# Patient Record
Sex: Female | Born: 1990 | Hispanic: No | Marital: Married | Smoking: Never smoker
Health system: Southern US, Community
[De-identification: ages and names within clinical notes are randomized; demographics above are authoritative.]

## PROBLEM LIST (undated history)

## (undated) ENCOUNTER — Inpatient Hospital Stay (HOSPITAL_COMMUNITY): Payer: Self-pay

## (undated) DIAGNOSIS — D649 Anemia, unspecified: Secondary | ICD-10-CM

## (undated) HISTORY — DX: Anemia, unspecified: D64.9

---

## 2012-08-01 LAB — OB RESULTS CONSOLE ANTIBODY SCREEN: Antibody Screen: NEGATIVE

## 2012-08-01 LAB — OB RESULTS CONSOLE RPR: RPR: NONREACTIVE

## 2012-08-01 LAB — OB RESULTS CONSOLE ABO/RH: RH Type: POSITIVE

## 2012-08-01 LAB — OB RESULTS CONSOLE VARICELLA ZOSTER ANTIBODY, IGG: Varicella: IMMUNE

## 2012-08-01 LAB — OB RESULTS CONSOLE PLATELET COUNT: Platelets: 228 10*3/uL

## 2012-08-01 LAB — OB RESULTS CONSOLE HGB/HCT, BLOOD: Hemoglobin: 1.5 g/dL

## 2012-12-12 ENCOUNTER — Encounter: Payer: Self-pay | Admitting: Advanced Practice Midwife

## 2012-12-12 ENCOUNTER — Ambulatory Visit (INDEPENDENT_AMBULATORY_CARE_PROVIDER_SITE_OTHER): Payer: BC Managed Care – PPO | Admitting: Advanced Practice Midwife

## 2012-12-12 VITALS — BP 105/69 | Ht 61.0 in | Wt 199.0 lb

## 2012-12-12 DIAGNOSIS — Z3400 Encounter for supervision of normal first pregnancy, unspecified trimester: Secondary | ICD-10-CM | POA: Insufficient documentation

## 2012-12-12 DIAGNOSIS — Z3403 Encounter for supervision of normal first pregnancy, third trimester: Secondary | ICD-10-CM

## 2012-12-12 NOTE — Progress Notes (Signed)
New OB transfer from [place]. Husband is still there. Here to be near family. See other note   Subjective:    Robin Figueroa is a G1P0 [redacted]w[redacted]d being seen today for her first obstetrical visit.  Her obstetrical history is significant for obesity and nervousness about birth process. Patient does intend to breast feed. Pregnancy history fully reviewed.  Patient reports no bleeding, no contractions, no leaking and nervousness about labor.  Filed Vitals:   12/12/12 1447 12/12/12 1524  BP: 105/69   Height:  5\' 1"  (1.549 m)  Weight: 199 lb (90.266 kg)     HISTORY: OB History  Gravida Para Term Preterm AB SAB TAB Ectopic Multiple Living  1             # Outcome Date GA Lbr Len/2nd Weight Sex Delivery Anes PTL Lv  1 CUR              Past Medical History  Diagnosis Date  . Anemia    History reviewed. No pertinent past surgical history. Family History  Problem Relation Age of Onset  . Diabetes Father      Exam    Uterus:  Fundal Height: 31 cm  Pelvic Exam:    Perineum: Exam deferred   Vulva: Deferred   Vagina:  deferred   pH:    Cervix: deferred   Adnexa: not evaluated   Bony Pelvis: n/a, unproven  System: Breast:  normal appearance, no masses or tenderness   Skin: normal coloration and turgor, no rashes    Neurologic: oriented, grossly non-focal   Extremities: normal strength, tone, and muscle mass   HEENT neck supple with midline trachea   Mouth/Teeth mucous membranes moist, pharynx normal without lesions   Neck supple   Cardiovascular: regular rate and rhythm   Respiratory:  appears well, vitals normal, no respiratory distress, acyanotic, normal RR, ear and throat exam is normal   Abdomen: soft, non-tender; bowel sounds normal; no masses,  no organomegaly   Urinary: urethral meatus normal      Assessment:    Pregnancy: G1P0 Patient Active Problem List   Diagnosis Date Noted  . Supervision of normal intrauterine pregnancy in primigravida 12/12/2012         Plan:     Initial labs drawn. Prenatal vitamins. Problem list reviewed and updated. Genetic Screening discussed Quad Screen: could not find results from records, [place] normal  Ultrasound discussed; fetal survey: results reviewed.  Follow up in 2 weeks. 50% of 30 min visit spent on counseling and coordination of care.   Routines reviewed.  Reassurance that we will guide her along   [facility] 12/12/2012

## 2012-12-12 NOTE — Progress Notes (Signed)
p-108  Transfer from [place].

## 2012-12-12 NOTE — Patient Instructions (Signed)
Third Trimester of Pregnancy  The third trimester is from week 29 through week 42, months 7 through 9. The third trimester is a time when the fetus is growing rapidly. At the end of the ninth month, the fetus is about 20 inches in length and weighs 6 10 pounds.   BODY CHANGES  Your body goes through many changes during pregnancy. The changes vary from woman to woman.    Your weight will continue to increase. You can expect to gain 25 35 pounds (11 16 kg) by the end of the pregnancy.   You may begin to get stretch marks on your hips, abdomen, and breasts.   You may urinate more often because the fetus is moving lower into your pelvis and pressing on your bladder.   You may develop or continue to have heartburn as a result of your pregnancy.   You may develop constipation because certain hormones are causing the muscles that push waste through your intestines to slow down.   You may develop hemorrhoids or swollen, bulging veins (varicose veins).   You may have pelvic pain because of the weight gain and pregnancy hormones relaxing your joints between the bones in your pelvis. Back aches may result from over exertion of the muscles supporting your posture.   Your breasts will continue to grow and be tender. A yellow discharge may leak from your breasts called colostrum.   Your belly button may stick out.   You may feel short of breath because of your expanding uterus.   You may notice the fetus "dropping," or moving lower in your abdomen.   You may have a bloody mucus discharge. This usually occurs a few days to a week before labor begins.   Your cervix becomes thin and soft (effaced) near your due date.  WHAT TO EXPECT AT YOUR PRENATAL EXAMS   You will have prenatal exams every 2 weeks until week 36. Then, you will have weekly prenatal exams. During a routine prenatal visit:   You will be weighed to make sure you and the fetus are growing normally.   Your blood pressure is taken.   Your abdomen will be  measured to track your baby's growth.   The fetal heartbeat will be listened to.   Any test results from the previous visit will be discussed.   You may have a cervical check near your due date to see if you have effaced.  At around 36 weeks, your caregiver will check your cervix. At the same time, your caregiver will also perform a test on the secretions of the vaginal tissue. This test is to determine if a type of bacteria, Group B streptococcus, is present. Your caregiver will explain this further.  Your caregiver may ask you:   What your birth plan is.   How you are feeling.   If you are feeling the baby move.   If you have had any abnormal symptoms, such as leaking fluid, bleeding, severe headaches, or abdominal cramping.   If you have any questions.  Other tests or screenings that may be performed during your third trimester include:   Blood tests that check for low iron levels (anemia).   Fetal testing to check the health, activity level, and growth of the fetus. Testing is done if you have certain medical conditions or if there are problems during the pregnancy.  FALSE LABOR  You may feel small, irregular contractions that eventually go away. These are called Braxton Hicks contractions, or   false labor. Contractions may last for hours, days, or even weeks before true labor sets in. If contractions come at regular intervals, intensify, or become painful, it is best to be seen by your caregiver.   SIGNS OF LABOR    Menstrual-like cramps.   Contractions that are 5 minutes apart or less.   Contractions that start on the top of the uterus and spread down to the lower abdomen and back.   A sense of increased pelvic pressure or back pain.   A watery or bloody mucus discharge that comes from the vagina.  If you have any of these signs before the 37th week of pregnancy, call your caregiver right away. You need to go to the hospital to get checked immediately.  HOME CARE INSTRUCTIONS    Avoid all  smoking, herbs, alcohol, and unprescribed drugs. These chemicals affect the formation and growth of the baby.   Follow your caregiver's instructions regarding medicine use. There are medicines that are either safe or unsafe to take during pregnancy.   Exercise only as directed by your caregiver. Experiencing uterine cramps is a good sign to stop exercising.   Continue to eat regular, healthy meals.   Wear a good support bra for breast tenderness.   Do not use hot tubs, steam rooms, or saunas.   Wear your seat belt at all times when driving.   Avoid raw meat, uncooked cheese, cat litter boxes, and soil used by cats. These carry germs that can cause birth defects in the baby.   Take your prenatal vitamins.   Try taking a stool softener (if your caregiver approves) if you develop constipation. Eat more high-fiber foods, such as fresh vegetables or fruit and whole grains. Drink plenty of fluids to keep your urine clear or pale yellow.   Take warm sitz baths to soothe any pain or discomfort caused by hemorrhoids. Use hemorrhoid cream if your caregiver approves.   If you develop varicose veins, wear support hose. Elevate your feet for 15 minutes, 3 4 times a day. Limit salt in your diet.   Avoid heavy lifting, wear low heal shoes, and practice good posture.   Rest a lot with your legs elevated if you have leg cramps or low back pain.   Visit your dentist if you have not gone during your pregnancy. Use a soft toothbrush to brush your teeth and be gentle when you floss.   A sexual relationship may be continued unless your caregiver directs you otherwise.   Do not travel far distances unless it is absolutely necessary and only with the approval of your caregiver.   Take prenatal classes to understand, practice, and ask questions about the labor and delivery.   Make a trial run to the hospital.   Pack your hospital bag.   Prepare the baby's nursery.   Continue to go to all your prenatal visits as directed  by your caregiver.  SEEK MEDICAL CARE IF:   You are unsure if you are in labor or if your water has broken.   You have dizziness.   You have mild pelvic cramps, pelvic pressure, or nagging pain in your abdominal area.   You have persistent nausea, vomiting, or diarrhea.   You have a bad smelling vaginal discharge.   You have pain with urination.  SEEK IMMEDIATE MEDICAL CARE IF:    You have a fever.   You are leaking fluid from your vagina.   You have spotting or bleeding from your vagina.     You have severe abdominal cramping or pain.   You have rapid weight loss or gain.   You have shortness of breath with chest pain.   You notice sudden or extreme swelling of your face, hands, ankles, feet, or legs.   You have not felt your baby move in over an hour.   You have severe headaches that do not go away with medicine.   You have vision changes.  Document Released: 12/21/2000 Document Revised: 08/29/2012 Document Reviewed: 02/28/2012  [employer] Patient Information 2014 [employer].

## 2012-12-13 ENCOUNTER — Encounter: Payer: Self-pay | Admitting: *Deleted

## 2012-12-27 ENCOUNTER — Ambulatory Visit (INDEPENDENT_AMBULATORY_CARE_PROVIDER_SITE_OTHER): Payer: BC Managed Care – PPO | Admitting: Obstetrics & Gynecology

## 2012-12-27 ENCOUNTER — Encounter: Payer: Self-pay | Admitting: Obstetrics & Gynecology

## 2012-12-27 VITALS — BP 110/78 | Wt 201.0 lb

## 2012-12-27 DIAGNOSIS — Z3400 Encounter for supervision of normal first pregnancy, unspecified trimester: Secondary | ICD-10-CM

## 2012-12-27 DIAGNOSIS — Z3403 Encounter for supervision of normal first pregnancy, third trimester: Secondary | ICD-10-CM

## 2012-12-27 LAB — POCT CBG (FASTING - GLUCOSE)-MANUAL ENTRY: Glucose Fasting, POC: 116 mg/dL — AB (ref 70–99)

## 2012-12-27 NOTE — Progress Notes (Signed)
No complaints.  Needs tour info.  Considering depo.  2 hr pp =

## 2012-12-27 NOTE — Progress Notes (Signed)
P=111 

## 2013-01-10 NOTE — L&D Delivery Note (Signed)
Delivery Note   Pt developed chorioamnionitis during labor and received ampicillin and gentamicin IV.  Fetal tachycardia with variable decelerations with return to baseline.  At 8:04 AM a viable and healthy female was delivered via Vaginal, Spontaneous Delivery (Presentation: Left Occiput Anterior). Loose nuchal x 1, reduced easily.  Dr. Despina HiddenEure and NICU staff present for delivery.  Infant meconium stained with none noted during labor.  APGAR: 9, 9; weight 9 lb 7.5 oz (4295 g).  Placenta status: Intact, Spontaneous Pathology.  Cord: 3 vessels with the following complications: None.  Cord pH: 7.2  Anesthesia: Epidural  Episiotomy: None Lacerations: 2nd degree;Perineal;Labial Suture Repair: 3.0 monocryl and 4.0 monocryl x 2.   Est. Blood Loss (mL): 350  Mom to postpartum.  Baby to Couplet care / Skin to Skin.  [facility],Robin Figueroa 02/18/2013, 9:01 AM

## 2013-01-14 ENCOUNTER — Encounter: Payer: Self-pay | Admitting: Advanced Practice Midwife

## 2013-01-14 ENCOUNTER — Ambulatory Visit (INDEPENDENT_AMBULATORY_CARE_PROVIDER_SITE_OTHER): Payer: BC Managed Care – PPO | Admitting: Advanced Practice Midwife

## 2013-01-14 VITALS — BP 90/60 | Wt 205.0 lb

## 2013-01-14 DIAGNOSIS — Z3400 Encounter for supervision of normal first pregnancy, unspecified trimester: Secondary | ICD-10-CM

## 2013-01-14 DIAGNOSIS — Z3403 Encounter for supervision of normal first pregnancy, third trimester: Secondary | ICD-10-CM

## 2013-01-14 NOTE — Progress Notes (Signed)
p-114  [facility]

## 2013-01-14 NOTE — Progress Notes (Signed)
Doing well.  Good fetal movement, denies vaginal bleeding, LOF, regular contractions.  Discussed signs of labor, reasons to come to hospital.  [facility] by [place] today.  GBS at next visit.

## 2013-01-28 ENCOUNTER — Encounter: Payer: Self-pay | Admitting: Advanced Practice Midwife

## 2013-01-28 ENCOUNTER — Ambulatory Visit (INDEPENDENT_AMBULATORY_CARE_PROVIDER_SITE_OTHER): Payer: BC Managed Care – PPO | Admitting: Advanced Practice Midwife

## 2013-01-28 VITALS — BP 97/64 | Wt 207.0 lb

## 2013-01-28 DIAGNOSIS — Z3490 Encounter for supervision of normal pregnancy, unspecified, unspecified trimester: Secondary | ICD-10-CM

## 2013-01-28 DIAGNOSIS — Z3480 Encounter for supervision of other normal pregnancy, unspecified trimester: Secondary | ICD-10-CM

## 2013-01-28 LAB — OB RESULTS CONSOLE GC/CHLAMYDIA
Chlamydia: NEGATIVE
Gonorrhea: NEGATIVE

## 2013-01-28 NOTE — Progress Notes (Signed)
p-116

## 2013-01-29 LAB — GC/CHLAMYDIA PROBE AMP
CT Probe RNA: NEGATIVE
GC Probe RNA: NEGATIVE

## 2013-01-29 NOTE — Progress Notes (Signed)
Doing well. GBS and cultures done.  

## 2013-01-30 LAB — CULTURE, BETA STREP (GROUP B ONLY)

## 2013-02-01 ENCOUNTER — Encounter: Payer: Self-pay | Admitting: Advanced Practice Midwife

## 2013-02-04 ENCOUNTER — Ambulatory Visit (INDEPENDENT_AMBULATORY_CARE_PROVIDER_SITE_OTHER): Payer: BC Managed Care – PPO | Admitting: Advanced Practice Midwife

## 2013-02-04 ENCOUNTER — Encounter: Payer: Self-pay | Admitting: Advanced Practice Midwife

## 2013-02-04 VITALS — BP 100/65 | Wt 207.0 lb

## 2013-02-04 DIAGNOSIS — Z3490 Encounter for supervision of normal pregnancy, unspecified, unspecified trimester: Secondary | ICD-10-CM

## 2013-02-04 DIAGNOSIS — Z3400 Encounter for supervision of normal first pregnancy, unspecified trimester: Secondary | ICD-10-CM

## 2013-02-04 NOTE — Progress Notes (Signed)
Discussed labor signs. Wants epidural. Wants Depo, Circ.  [place].

## 2013-02-04 NOTE — Progress Notes (Signed)
p=104 

## 2013-02-04 NOTE — Patient Instructions (Signed)

## 2013-02-11 ENCOUNTER — Ambulatory Visit (INDEPENDENT_AMBULATORY_CARE_PROVIDER_SITE_OTHER): Payer: BC Managed Care – PPO | Admitting: Advanced Practice Midwife

## 2013-02-11 VITALS — BP 98/54 | Wt 210.0 lb

## 2013-02-11 DIAGNOSIS — Z3400 Encounter for supervision of normal first pregnancy, unspecified trimester: Secondary | ICD-10-CM

## 2013-02-11 NOTE — Progress Notes (Signed)
P-118 

## 2013-02-11 NOTE — Progress Notes (Signed)
Discussed postdates testing. NST at [facility] if not delivered. GBS neg.

## 2013-02-11 NOTE — Patient Instructions (Signed)
Braxton Hicks Contractions Pregnancy is commonly associated with contractions of the uterus throughout the pregnancy. Towards the end of pregnancy (32 to 34 weeks), these contractions (Braxton Hicks) can develop more often and may become more forceful. This is not true labor because these contractions do not result in opening (dilatation) and thinning of the cervix. They are sometimes difficult to tell apart from true labor because these contractions can be forceful and people have different pain tolerances. You should not feel embarrassed if you go to the hospital with false labor. Sometimes, the only way to tell if you are in true labor is for your caregiver to follow the changes in the cervix. How to tell the difference between true and false labor:  False labor.  The contractions of false labor are usually shorter, irregular and not as hard as those of true labor.  They are often felt in the front of the lower abdomen and in the groin.  They may leave with walking around or changing positions while lying down.  They get weaker and are shorter lasting as time goes on.  These contractions are usually irregular.  They do not usually become progressively stronger, regular and closer together as with true labor.  True labor.  Contractions in true labor last 30 to 70 seconds, become very regular, usually become more intense, and increase in frequency.  They do not go away with walking.  The discomfort is usually felt in the top of the uterus and spreads to the lower abdomen and low back.  True labor can be determined by your caregiver with an exam. This will show that the cervix is dilating and getting thinner. If there are no prenatal problems or other health problems associated with the pregnancy, it is completely safe to be sent home with false labor and await the onset of true labor. HOME CARE INSTRUCTIONS   Keep up with your usual exercises and instructions.  Take medications as  directed.  Keep your regular prenatal appointment.  Eat and drink lightly if you think you are going into labor.  If BH contractions are making you uncomfortable:  Change your activity position from lying down or resting to walking/walking to resting.  Sit and rest in a tub of warm water.  Drink 2 to 3 glasses of water. Dehydration may cause B-H contractions.  Do slow and deep breathing several times an hour. SEEK IMMEDIATE MEDICAL CARE IF:   Your contractions continue to become stronger, more regular, and closer together.  You have a gushing, burst or leaking of fluid from the vagina.  An oral temperature above 102 F (38.9 C) develops.  You have passage of blood-tinged mucus.  You develop vaginal bleeding.  You develop continuous belly (abdominal) pain.  You have low back pain that you never had before.  You feel the baby's head pushing down causing pelvic pressure.  The baby is not moving as much as it used to. Document Released: 12/27/2004 Document Revised: 03/21/2011 Document Reviewed: 10/08/2012 Exit[employer] Patient Information 2014 [employer].  Fetal Movement Counts Patient Name: __________________________________________________ Patient Due Date: ____________________ Performing a fetal movement count is highly recommended in high-risk pregnancies, but it is good for every pregnant woman to do. Your caregiver may ask you to start counting fetal movements at 28 weeks of the pregnancy. Fetal movements often increase:  After eating a full meal.  After physical activity.  After eating or drinking something sweet or cold.  At rest. Pay attention to when you feel   the baby is most active. This will help you notice a pattern of your baby's sleep and wake cycles and what factors contribute to an increase in fetal movement. It is important to perform a fetal movement count at the same time each day when your baby is normally most active.  HOW TO COUNT FETAL  MOVEMENTS 1. Find a quiet and comfortable area to sit or lie down on your left side. Lying on your left side provides the best blood and oxygen circulation to your baby. 2. Write down the day and time on a sheet of paper or in a journal. 3. Start counting kicks, flutters, swishes, rolls, or jabs in a 2 hour period. You should feel at least 10 movements within 2 hours. 4. If you do not feel 10 movements in 2 hours, wait 2 3 hours and count again. Look for a change in the pattern or not enough counts in 2 hours. SEEK MEDICAL CARE IF:  You feel less than 10 counts in 2 hours, tried twice.  There is no movement in over an hour.  The pattern is changing or taking longer each day to reach 10 counts in 2 hours.  You feel the baby is not moving as he or she usually does. Date: ____________ Movements: ____________ Start time: ____________ Finish time: ____________  Date: ____________ Movements: ____________ Start time: ____________ Finish time: ____________ Date: ____________ Movements: ____________ Start time: ____________ Finish time: ____________ Date: ____________ Movements: ____________ Start time: ____________ Finish time: ____________ Date: ____________ Movements: ____________ Start time: ____________ Finish time: ____________ Date: ____________ Movements: ____________ Start time: ____________ Finish time: ____________ Date: ____________ Movements: ____________ Start time: ____________ Finish time: ____________ Date: ____________ Movements: ____________ Start time: ____________ Finish time: ____________  Date: ____________ Movements: ____________ Start time: ____________ Finish time: ____________ Date: ____________ Movements: ____________ Start time: ____________ Finish time: ____________ Date: ____________ Movements: ____________ Start time: ____________ Finish time: ____________ Date: ____________ Movements: ____________ Start time: ____________ Finish time: ____________ Date: ____________  Movements: ____________ Start time: ____________ Finish time: ____________ Date: ____________ Movements: ____________ Start time: ____________ Finish time: ____________ Date: ____________ Movements: ____________ Start time: ____________ Finish time: ____________  Date: ____________ Movements: ____________ Start time: ____________ Finish time: ____________ Date: ____________ Movements: ____________ Start time: ____________ Finish time: ____________ Date: ____________ Movements: ____________ Start time: ____________ Finish time: ____________ Date: ____________ Movements: ____________ Start time: ____________ Finish time: ____________ Date: ____________ Movements: ____________ Start time: ____________ Finish time: ____________ Date: ____________ Movements: ____________ Start time: ____________ Finish time: ____________ Date: ____________ Movements: ____________ Start time: ____________ Finish time: ____________  Date: ____________ Movements: ____________ Start time: ____________ Finish time: ____________ Date: ____________ Movements: ____________ Start time: ____________ Finish time: ____________ Date: ____________ Movements: ____________ Start time: ____________ Finish time: ____________ Date: ____________ Movements: ____________ Start time: ____________ Finish time: ____________ Date: ____________ Movements: ____________ Start time: ____________ Finish time: ____________ Date: ____________ Movements: ____________ Start time: ____________ Finish time: ____________ Date: ____________ Movements: ____________ Start time: ____________ Finish time: ____________  Date: ____________ Movements: ____________ Start time: ____________ Finish time: ____________ Date: ____________ Movements: ____________ Start time: ____________ Finish time: ____________ Date: ____________ Movements: ____________ Start time: ____________ Finish time: ____________ Date: ____________ Movements: ____________ Start time:  ____________ Finish time: ____________ Date: ____________ Movements: ____________ Start time: ____________ Finish time: ____________ Date: ____________ Movements: ____________ Start time: ____________ Finish time: ____________ Date: ____________ Movements: ____________ Start time: ____________ Finish time: ____________  Date: ____________ Movements: ____________ Start time: ____________ Finish time: ____________ Date: ____________ Movements: ____________ Start   time: ____________ Finish time: ____________ Date: ____________ Movements: ____________ Start time: ____________ Finish time: ____________ Date: ____________ Movements: ____________ Start time: ____________ Finish time: ____________ Date: ____________ Movements: ____________ Start time: ____________ Finish time: ____________ Date: ____________ Movements: ____________ Start time: ____________ Finish time: ____________ Date: ____________ Movements: ____________ Start time: ____________ Finish time: ____________  Date: ____________ Movements: ____________ Start time: ____________ Finish time: ____________ Date: ____________ Movements: ____________ Start time: ____________ Finish time: ____________ Date: ____________ Movements: ____________ Start time: ____________ Finish time: ____________ Date: ____________ Movements: ____________ Start time: ____________ Finish time: ____________ Date: ____________ Movements: ____________ Start time: ____________ Finish time: ____________ Date: ____________ Movements: ____________ Start time: ____________ Finish time: ____________ Date: ____________ Movements: ____________ Start time: ____________ Finish time: ____________  Date: ____________ Movements: ____________ Start time: ____________ Finish time: ____________ Date: ____________ Movements: ____________ Start time: ____________ Finish time: ____________ Date: ____________ Movements: ____________ Start time: ____________ Finish time: ____________ Date:  ____________ Movements: ____________ Start time: ____________ Finish time: ____________ Date: ____________ Movements: ____________ Start time: ____________ Finish time: ____________ Date: ____________ Movements: ____________ Start time: ____________ Finish time: ____________ Document Released: 01/26/2006 Document Revised: 12/14/2011 Document Reviewed: 10/24/2011 [employer] Patient Information 2014 [employer].  

## 2013-02-17 ENCOUNTER — Inpatient Hospital Stay (HOSPITAL_COMMUNITY): Payer: BC Managed Care – PPO | Admitting: Anesthesiology

## 2013-02-17 ENCOUNTER — Encounter (HOSPITAL_COMMUNITY): Payer: Self-pay | Admitting: *Deleted

## 2013-02-17 ENCOUNTER — Encounter (HOSPITAL_COMMUNITY): Payer: BC Managed Care – PPO | Admitting: Anesthesiology

## 2013-02-17 ENCOUNTER — Inpatient Hospital Stay (HOSPITAL_COMMUNITY)
Admission: AD | Admit: 2013-02-17 | Discharge: 2013-02-20 | DRG: 774 | Disposition: A | Discharge status: Home / Self Care | Payer: BC Managed Care – PPO | Source: Ambulatory Visit | Attending: Obstetrics & Gynecology | Admitting: Obstetrics & Gynecology

## 2013-02-17 DIAGNOSIS — O411090 Infection of amniotic sac and membranes, unspecified, unspecified trimester, not applicable or unspecified: Secondary | ICD-10-CM | POA: Diagnosis present

## 2013-02-17 DIAGNOSIS — O411290 Chorioamnionitis, unspecified trimester, not applicable or unspecified: Secondary | ICD-10-CM

## 2013-02-17 DIAGNOSIS — IMO0001 Reserved for inherently not codable concepts without codable children: Secondary | ICD-10-CM

## 2013-02-17 LAB — CBC
HCT: 34.4 % — ABNORMAL LOW (ref 36.0–46.0)
Hemoglobin: 11.1 g/dL — ABNORMAL LOW (ref 12.0–15.0)
MCH: 24.9 pg — AB (ref 26.0–34.0)
MCHC: 32.3 g/dL (ref 30.0–36.0)
MCV: 77.1 fL — ABNORMAL LOW (ref 78.0–100.0)
PLATELETS: 255 10*3/uL (ref 150–400)
RBC: 4.46 MIL/uL (ref 3.87–5.11)
RDW: 16.1 % — AB (ref 11.5–15.5)
WBC: 10.6 10*3/uL — ABNORMAL HIGH (ref 4.0–10.5)

## 2013-02-17 LAB — TYPE AND SCREEN
ABO/RH(D): B POS
Antibody Screen: NEGATIVE

## 2013-02-17 LAB — OB RESULTS CONSOLE GBS: STREP GROUP B AG: NEGATIVE

## 2013-02-17 LAB — RPR: RPR: NONREACTIVE

## 2013-02-17 LAB — ABO/RH: ABO/RH(D): B POS

## 2013-02-17 MED ORDER — LACTATED RINGERS IV SOLN
500.0000 mL | INTRAVENOUS | Status: DC | PRN
  Administered 2013-02-18 (×3): 500 mL via INTRAVENOUS

## 2013-02-17 MED ORDER — ACETAMINOPHEN 325 MG PO TABS
650.0000 mg | ORAL_TABLET | ORAL | Status: DC | PRN
  Administered 2013-02-18: 650 mg via ORAL
  Filled 2013-02-17: qty 2

## 2013-02-17 MED ORDER — FENTANYL 2.5 MCG/ML BUPIVACAINE 1/10 % EPIDURAL INFUSION (WH - ANES)
14.0000 mL/h | INTRAMUSCULAR | Status: DC | PRN
  Administered 2013-02-18: 14 mL/h via EPIDURAL
  Filled 2013-02-17 (×2): qty 125

## 2013-02-17 MED ORDER — OXYCODONE-ACETAMINOPHEN 5-325 MG PO TABS: 1.0000 | ORAL_TABLET | ORAL | Status: DC | PRN

## 2013-02-17 MED ORDER — CITRIC ACID-SODIUM CITRATE 334-500 MG/5ML PO SOLN: 30.0000 mL | ORAL | Status: DC | PRN

## 2013-02-17 MED ORDER — DIPHENHYDRAMINE HCL 50 MG/ML IJ SOLN: 12.5000 mg | INTRAMUSCULAR | Status: DC | PRN

## 2013-02-17 MED ORDER — LIDOCAINE HCL (PF) 1 % IJ SOLN
INTRAMUSCULAR | Status: DC | PRN
  Administered 2013-02-17 (×3): 5 mL

## 2013-02-17 MED ORDER — OXYTOCIN 40 UNITS IN LACTATED RINGERS INFUSION - SIMPLE MED
1.0000 m[IU]/min | INTRAVENOUS | Status: DC
  Administered 2013-02-17: 2 m[IU]/min via INTRAVENOUS

## 2013-02-17 MED ORDER — OXYTOCIN 40 UNITS IN LACTATED RINGERS INFUSION - SIMPLE MED
62.5000 mL/h | INTRAVENOUS | Status: DC
  Filled 2013-02-17: qty 1000

## 2013-02-17 MED ORDER — PHENYLEPHRINE 40 MCG/ML (10ML) SYRINGE FOR IV PUSH (FOR BLOOD PRESSURE SUPPORT)
80.0000 ug | PREFILLED_SYRINGE | INTRAVENOUS | Status: DC | PRN
  Filled 2013-02-17: qty 2
  Filled 2013-02-17: qty 10

## 2013-02-17 MED ORDER — IBUPROFEN 600 MG PO TABS
600.0000 mg | ORAL_TABLET | Freq: Four times a day (QID) | ORAL | Status: DC | PRN
  Administered 2013-02-18: 600 mg via ORAL
  Filled 2013-02-17: qty 1

## 2013-02-17 MED ORDER — FENTANYL CITRATE 0.05 MG/ML IJ SOLN: 100.0000 ug | INTRAMUSCULAR | Status: DC | PRN

## 2013-02-17 MED ORDER — EPHEDRINE 5 MG/ML INJ
10.0000 mg | INTRAVENOUS | Status: DC | PRN
  Filled 2013-02-17: qty 4
  Filled 2013-02-17: qty 2

## 2013-02-17 MED ORDER — LIDOCAINE HCL (PF) 1 % IJ SOLN
30.0000 mL | INTRAMUSCULAR | Status: AC | PRN
  Administered 2013-02-18: 30 mL via SUBCUTANEOUS
  Filled 2013-02-17 (×2): qty 30

## 2013-02-17 MED ORDER — PHENYLEPHRINE 40 MCG/ML (10ML) SYRINGE FOR IV PUSH (FOR BLOOD PRESSURE SUPPORT)
80.0000 ug | PREFILLED_SYRINGE | INTRAVENOUS | Status: DC | PRN
  Filled 2013-02-17: qty 2

## 2013-02-17 MED ORDER — FENTANYL 2.5 MCG/ML BUPIVACAINE 1/10 % EPIDURAL INFUSION (WH - ANES)
INTRAMUSCULAR | Status: DC | PRN
  Administered 2013-02-17: 14 mL/h via EPIDURAL

## 2013-02-17 MED ORDER — ONDANSETRON HCL 4 MG/2ML IJ SOLN: 4.0000 mg | Freq: Four times a day (QID) | INTRAMUSCULAR | Status: DC | PRN

## 2013-02-17 MED ORDER — TERBUTALINE SULFATE 1 MG/ML IJ SOLN: 0.2500 mg | Freq: Once | INTRAMUSCULAR | Status: AC | PRN

## 2013-02-17 MED ORDER — LACTATED RINGERS IV SOLN
INTRAVENOUS | Status: DC
  Administered 2013-02-17 – 2013-02-18 (×4): via INTRAVENOUS

## 2013-02-17 MED ORDER — EPHEDRINE 5 MG/ML INJ
10.0000 mg | INTRAVENOUS | Status: DC | PRN
  Filled 2013-02-17: qty 2

## 2013-02-17 MED ORDER — OXYTOCIN BOLUS FROM INFUSION
500.0000 mL | INTRAVENOUS | Status: DC
  Administered 2013-02-18: 500 mL via INTRAVENOUS

## 2013-02-17 MED ORDER — LACTATED RINGERS IV SOLN
500.0000 mL | Freq: Once | INTRAVENOUS | Status: AC
  Administered 2013-02-17: 500 mL via INTRAVENOUS

## 2013-02-17 NOTE — Anesthesia Procedure Notes (Signed)
Epidural Patient location during procedure: OB  Staffing Anesthesiologist: Cillian Gwinner Performed by: anesthesiologist   Preanesthetic Checklist Completed: patient identified, site marked, surgical consent, pre-op evaluation, timeout performed, IV checked, risks and benefits discussed and monitors and equipment checked  Epidural Patient position: sitting Prep: ChloraPrep Patient monitoring: heart rate, continuous pulse ox and blood pressure Approach: right paramedian Injection technique: LOR saline  Needle:  Needle type: Tuohy  Needle gauge: 17 G Needle length: 9 cm and 9 Needle insertion depth: 7 cm Catheter type: closed end flexible Catheter size: 20 Guage Catheter at skin depth: 12 cm Test dose: negative  Assessment Events: blood not aspirated, injection not painful, no injection resistance, negative IV test and no paresthesia  Additional Notes   Patient tolerated the insertion well without complications.   

## 2013-02-17 NOTE — MAU Note (Signed)
Pt states water broke at 0411 this morning, clear fluid. Denies complications with pregnancy. Denies contractions at this time. Positive fetal movement.

## 2013-02-17 NOTE — Anesthesia Preprocedure Evaluation (Signed)

## 2013-02-17 NOTE — H&P (Signed)
Robin GentrySaira B Figueroa is a 23 y.o. female presenting for spontaneous rupture of membranes at 0400 today with irregular contractions starting approximately 20 minutes later.  [facility] [place] [facility] [place]  Pregnancy dated by 12 wk ultrasound (prenatal records).  No complications noted for this pregnancy.  Maternal Medical History:  Reason for admission: Rupture of membranes and contractions.   Contractions: Onset was 3-5 hours ago.   Frequency: irregular.    Fetal activity: Perceived fetal activity is normal.   Last perceived fetal movement was within the past hour.    Prenatal complications: no prenatal complications   OB History   Grav Para Term Preterm Abortions TAB SAB Ect Mult Living   1              Past Medical History  Diagnosis Date  . Anemia    No past surgical history on file. Family History: family history includes Diabetes in her father. Social History:  reports that she has never smoked. She has never used smokeless tobacco. She reports that she does not drink alcohol or use illicit drugs.   Review of Systems  Gastrointestinal: Positive for abdominal pain (contractions).  Genitourinary:       Rupture of membranes  All other systems reviewed and are negative.    Dilation: 3 Effacement (%): 60 Station: -2 Exam by:: GPayne, RN Blood pressure 116/63, pulse 109, temperature 98.3 F (36.8 C), temperature source Oral, resp. rate 16, height 5\' 2"  (1.575 m), weight 95.618 kg (210 lb 12.8 oz), last menstrual period 05/11/2012, SpO2 99.00%. Maternal Exam:  Uterine Assessment: Contraction strength is mild.  Abdomen: Estimated fetal weight is 8-8.5 lbs.    Introitus: Vagina is positive for vaginal discharge (mucusy).    Fetal Exam Fetal Monitor Review: Baseline rate: 130's.  Variability: moderate (6-25 bpm).   Pattern: accelerations present.    Fetal State Assessment: Category I - tracings are normal.     Physical  Exam  Constitutional: She is oriented to person, place, and time. She appears well-developed and well-nourished. No distress.  HENT:  Head: Normocephalic.  Neck: Normal range of motion. Neck supple.  Cardiovascular: Normal rate, regular rhythm and normal heart sounds.   Respiratory: Effort normal and breath sounds normal.  GI: Soft. There is no tenderness.  Genitourinary: No bleeding around the vagina. Vaginal discharge (mucusy) found.  Musculoskeletal: Normal range of motion. She exhibits edema (1+bilat pedal).  Neurological: She is alert and oriented to person, place, and time.  Skin: Skin is warm and dry.    Prenatal labs: ABO, Rh: B/Positive/-- (07/23 0000) Antibody: Negative (07/23 0000) Rubella: Nonimmune (07/23 0000) RPR: Nonreactive (07/23 0000)  HBsAg: Negative (07/23 0000)  HIV: Non-reactive (07/23 0000)  GBS: Negative (02/08 0000)   Assessment/Plan: 23 yo G1P0 at 4752w2d wks IUP Spontaneous Rupture of Membranes GBS negative  [facility]: [facility] request Reassess in 2 hours Anticipate NSVD   [facility],Caylie Sandquist 02/17/2013, 6:09 AM

## 2013-02-17 NOTE — Progress Notes (Signed)
Robin GentrySaira B Figueroa is a 23 y.o. G1P0 at 1736w2d by ultrasound admitted for rupture of membranes  Subjective: Comfortable.   Objective: BP 95/50  Pulse 83  Temp(Src) 98.1 F (36.7 C) (Oral)  Resp 18  Ht 5\' 2"  (1.575 m)  Wt 95.618 kg (210 lb 12.8 oz)  BMI 38.55 kg/m2  SpO2 100%  LMP 05/11/2012      FHT:  FHR: 150 bpm, variability: moderate,  accelerations:  Present,  decelerations:  Present 1 rare early UC:   regular, every 2-3 minutes SVE:   Dilation: 6 Effacement (%): 60 Station: -2 Exam by:: GPayne, RN  Labs: Lab Results  Component Value Date   WBC 10.6* 02/17/2013   HGB 11.1* 02/17/2013   HCT 34.4* 02/17/2013   MCV 77.1* 02/17/2013   PLT 255 02/17/2013    Assessment / Plan: Augmentation of labor, progressing well  Labor: Progressing on pitocin. IUPC placed. Preeclampsia:  na- Bp low normal Fetal Wellbeing:  Category I Pain Control:  Epidural I/D:  GBS neg Anticipated MOD:  NSVD  [name], Selina Tapper L 02/17/2013, 10:39 PM

## 2013-02-17 NOTE — Progress Notes (Signed)
Dene GentrySaira B Livolsi is a 23 y.o. G1P0 at 7660w2d.   Subjective: Mild contractions  Objective: BP 98/73  Pulse 113  Temp(Src) 97.8 F (36.6 C) (Oral)  Resp 20  Ht 5\' 2"  (1.575 m)  Wt 95.618 kg (210 lb 12.8 oz)  BMI 38.55 kg/m2  SpO2 99%  LMP 05/11/2012      FHT:  FHR: 140 bpm, variability: moderate,  accelerations:  Present,  decelerations:  Absent UC:   regular, every 4 minutes, mild SVE:   Dilation: 3.5 Effacement (%): Thick Station: -2 Exam by:: v.Jakson Delpilar,cnm  Labs: Lab Results  Component Value Date   WBC 10.6* 02/17/2013   HGB 11.1* 02/17/2013   HCT 34.4* 02/17/2013   MCV 77.1* 02/17/2013   PLT 255 02/17/2013    Assessment / Plan: Protracted latent phase  Labor: Protracted latent phase Preeclampsia:  NA Fetal Wellbeing:  Category I Pain Control:  Labor support without medications I/D:  n/a Anticipated MOD:  NSVD Start Pitocin augmentation.  Tinie Mcgloin 02/17/2013, 10:59 AM

## 2013-02-17 NOTE — Progress Notes (Signed)
Robin GentrySaira B Figueroa is a 23 y.o. G1P0 at 5296w2d.  Subjective: Comfortable w/ epidural.  Objective: BP 92/41  Pulse 73  Temp(Src) 98.1 F (36.7 C) (Oral)  Resp 20  Ht 5\' 2"  (1.575 m)  Wt 95.618 kg (210 lb 12.8 oz)  BMI 38.55 kg/m2  SpO2 100%  LMP 05/11/2012      FHT:  FHR: 140 bpm, variability: moderate,  accelerations:  Present,  decelerations:  Present [place] UC:   regular, every 2-3 minutes, moderate to strong SVE:   Dilation: 6 Effacement (%): 70 Station: -2 Asynclitic. Cervix displaced to maternal left Exam by:: foley,rn Forebag AROM'd by CNM  Labs: Lab Results  Component Value Date   WBC 10.6* 02/17/2013   HGB 11.1* 02/17/2013   HCT 34.4* 02/17/2013   MCV 77.1* 02/17/2013   PLT 255 02/17/2013    Assessment / Plan: Augmentation of labor, progressing well  Labor: Progressing on Pitocin. Preeclampsia:  NA Fetal Wellbeing:  Category I Pain Control:  Epidural I/D:  n/a Anticipated MOD:  NSVD Encourage position changes.   Dorathy KinsmanSMITH, Robin Figueroa 02/17/2013, 6:16 PM

## 2013-02-17 NOTE — Progress Notes (Signed)
Robin GentrySaira B Figueroa is a 23 y.o. G1P0 at 3713w2d.  Subjective: More uncomfortable w/ UC's.  Objective: BP 103/60  Pulse 82  Temp(Src) 98.3 F (36.8 C) (Oral)  Resp 18  Ht 5\' 2"  (1.575 m)  Wt 95.618 kg (210 lb 12.8 oz)  BMI 38.55 kg/m2  SpO2 99%  LMP 05/11/2012      FHT:  FHR: 135 bpm, variability: moderate,  accelerations:  Present,  decelerations:  Absent UC:   regular, every 2-3 minutes, mild-moderate SVE:   Dilation: 4 Effacement (%): 60;70 Station: -2 Anterior Exam by:: foley,rn  Labs: Lab Results  Component Value Date   WBC 10.6* 02/17/2013   HGB 11.1* 02/17/2013   HCT 34.4* 02/17/2013   MCV 77.1* 02/17/2013   PLT 255 02/17/2013    Assessment / Plan: Augmentation of labor, progressing well  Labor: Progressing normally Preeclampsia:  NA Fetal Wellbeing:  Category I Pain Control:  Labor support without medications I/D:  n/a Anticipated MOD:  NSVD  Robin Figueroa 02/17/2013, 2:21 PM

## 2013-02-18 ENCOUNTER — Encounter (HOSPITAL_COMMUNITY): Payer: Self-pay

## 2013-02-18 ENCOUNTER — Encounter: Payer: BC Managed Care – PPO | Admitting: Advanced Practice Midwife

## 2013-02-18 DIAGNOSIS — O411090 Infection of amniotic sac and membranes, unspecified, unspecified trimester, not applicable or unspecified: Secondary | ICD-10-CM

## 2013-02-18 MED ORDER — ONDANSETRON HCL 4 MG/2ML IJ SOLN: 4.0000 mg | INTRAMUSCULAR | Status: DC | PRN

## 2013-02-18 MED ORDER — PRENATAL MULTIVITAMIN CH
1.0000 | ORAL_TABLET | Freq: Every day | ORAL | Status: DC
  Administered 2013-02-18 – 2013-02-20 (×3): 1 via ORAL
  Filled 2013-02-18 (×3): qty 1

## 2013-02-18 MED ORDER — DEXTROSE 5 % IV SOLN
180.0000 mg | Freq: Once | INTRAVENOUS | Status: AC
  Administered 2013-02-18: 180 mg via INTRAVENOUS
  Filled 2013-02-18: qty 4.5

## 2013-02-18 MED ORDER — DIBUCAINE 1 % RE OINT: 1.0000 "application " | TOPICAL_OINTMENT | RECTAL | Status: DC | PRN

## 2013-02-18 MED ORDER — WITCH HAZEL-GLYCERIN EX PADS: 1.0000 "application " | MEDICATED_PAD | CUTANEOUS | Status: DC | PRN

## 2013-02-18 MED ORDER — OXYCODONE-ACETAMINOPHEN 5-325 MG PO TABS: 1.0000 | ORAL_TABLET | ORAL | Status: DC | PRN

## 2013-02-18 MED ORDER — ZOLPIDEM TARTRATE 5 MG PO TABS: 5.0000 mg | ORAL_TABLET | Freq: Every evening | ORAL | Status: DC | PRN

## 2013-02-18 MED ORDER — DEXTROSE 5 % IV SOLN
160.0000 mg | Freq: Three times a day (TID) | INTRAVENOUS | Status: DC
  Filled 2013-02-18: qty 4

## 2013-02-18 MED ORDER — ONDANSETRON HCL 4 MG PO TABS
4.0000 mg | ORAL_TABLET | ORAL | Status: DC | PRN
Start: 2013-02-18 — End: 2013-02-20

## 2013-02-18 MED ORDER — TETANUS-DIPHTH-ACELL PERTUSSIS 5-2.5-18.5 LF-MCG/0.5 IM SUSP: 0.5000 mL | Freq: Once | INTRAMUSCULAR | Status: DC

## 2013-02-18 MED ORDER — DIPHENHYDRAMINE HCL 25 MG PO CAPS: 25.0000 mg | ORAL_CAPSULE | Freq: Four times a day (QID) | ORAL | Status: DC | PRN

## 2013-02-18 MED ORDER — SENNOSIDES-DOCUSATE SODIUM 8.6-50 MG PO TABS
2.0000 | ORAL_TABLET | ORAL | Status: DC
  Administered 2013-02-19: 2 via ORAL
  Filled 2013-02-18 (×3): qty 2

## 2013-02-18 MED ORDER — SIMETHICONE 80 MG PO CHEW: 80.0000 mg | CHEWABLE_TABLET | ORAL | Status: DC | PRN

## 2013-02-18 MED ORDER — LANOLIN HYDROUS EX OINT: TOPICAL_OINTMENT | CUTANEOUS | Status: DC | PRN

## 2013-02-18 MED ORDER — BENZOCAINE-MENTHOL 20-0.5 % EX AERO
1.0000 "application " | INHALATION_SPRAY | CUTANEOUS | Status: DC | PRN
  Administered 2013-02-18: 1 via TOPICAL
  Filled 2013-02-18: qty 56

## 2013-02-18 MED ORDER — SODIUM CHLORIDE 0.9 % IV SOLN
2.0000 g | Freq: Four times a day (QID) | INTRAVENOUS | Status: DC
  Administered 2013-02-18: 2 g via INTRAVENOUS
  Filled 2013-02-18 (×3): qty 2000

## 2013-02-18 MED ORDER — ACETAMINOPHEN 500 MG PO TABS
1000.0000 mg | ORAL_TABLET | Freq: Once | ORAL | Status: AC
  Administered 2013-02-18: 1000 mg via ORAL
  Filled 2013-02-18: qty 2

## 2013-02-18 MED ORDER — IBUPROFEN 600 MG PO TABS
600.0000 mg | ORAL_TABLET | Freq: Four times a day (QID) | ORAL | Status: DC
  Administered 2013-02-18 – 2013-02-20 (×7): 600 mg via ORAL
  Filled 2013-02-18 (×8): qty 1

## 2013-02-18 NOTE — Progress Notes (Addendum)
Patient ID: [name], female   DOB: 03/31/1990, [age] y.o.   MRN: [id number]  Soon after last check,  Fetal tachycardia noted. Maternal temp of 100.3 increased to 101.    Amp and Gent ordered Tylenol w/  IV fluid bolus  - Burman BlacksmithPatricia Feeney DO PGY-1  I spoke with the resident, reviewed patient and agree with above note and plan of care.  Tawana ScaleMichael Ryan Yussuf Sawyers, MD OB Fellow 02/18/2013 2:52 AM

## 2013-02-18 NOTE — Progress Notes (Signed)
ANTIBIOTIC CONSULT NOTE - INITIAL  Pharmacy Consult for gentamicin Indication: maternal fever - R/O chorioamnionitis  No Known Allergies  Patient Measurements: Height: 5\' 2"  (157.5 cm) Weight: 210 lb 12.8 oz (95.618 kg) IBW/kg (Calculated) : 50.1 Adjusted Body Weight: 65.3 kg  Vital Signs: Temp: 101 F (38.3 C) (02/09 0200) Temp src: Oral (02/09 0200) BP: 106/47 mmHg (02/09 0200) Pulse Rate: 87 (02/09 0200) Intake/Output from previous day: 02/08 0701 - 02/09 0700 In: -  Out: 1500 [Urine:1500] Intake/Output from this shift: Total I/O In: -  Out: 1500 [Urine:1500]  Labs:  Recent Labs  02/17/13 0630  WBC 10.6*  HGB 11.1*  PLT 255   CrCl is unknown because no creatinine reading has been taken.  Estimated CrCl for age and pregnancy = 115-132ml/min   Microbiology: Recent Results (from the past 720 hour(s))  CULTURE, BETA STREP (GROUP B ONLY)     Status: None   Collection Time    01/28/13 10:51 AM      Result Value Range Status   Organism ID, Bacteria NO GROUP B STREP (S.AGALACTIAE) ISOLATED   Final  GC/CHLAMYDIA PROBE AMP     Status: None   Collection Time    01/28/13 11:15 AM      Result Value Range Status   CT Probe RNA NEGATIVE   Final   GC Probe RNA NEGATIVE   Final   Comment:                                                                                             **Normal Reference Range: Negative**                 Assay performed using the Gen-Probe APTIMA COMBO2 (R) Assay.           Acceptable specimen types for this assay include APTIMA Swabs (Unisex,     endocervical, urethral, or vaginal), first void urine, and ThinPrep     liquid based cytology samples.  OB RESULTS CONSOLE GBS     Status: None   Collection Time    02/17/13 12:00 AM      Result Value Range Status   GBS Negative   Final    Medical History: Past Medical History  Diagnosis Date  . Anemia     Medications:  Scheduled:  . ampicillin (OMNIPEN) IV  2 g Intravenous Q6H  .  gentamicin  160 mg Intravenous Q8H  . gentamicin  180 mg Intravenous Once   Infusions:  . fentaNYL 2.5 mcg/ml w/bupivacaine 1/10% in NS epidural infusion ( total) 14 mL/hr (02/18/13 0050)  . lactated ringers 125 mL/hr at 02/17/13 2325  . oxytocin 40 units in LR 1000 mL    . oxytocin 40 units in LR 1000 mL    . oxytocin 40 units in LR 1000 mL 16 milli-units/min (02/17/13 2158)   PRN: acetaminophen, citric acid-sodium citrate, diphenhydrAMINE, ePHEDrine, ePHEDrine, fentaNYL, fentaNYL 2.5 mcg/ml w/bupivacaine 1/10% in NS epidural infusion ( total), ibuprofen, lactated ringers, lidocaine (PF), ondansetron, oxyCODONE-acetaminophen, phenylephrine, phenylephrine  Assessment: 69yr G1P0 term pregnancy with sx's of maternal fever - R/O chorioamnionitis.  To  be treated with Ampicillin 2gm IV q6h and gentamicin.  Goal of Therapy:  Desire gentamicin serum peak level 6-8 mcg/ml and trough level <121mcg/ml.  Plan:  1.  Gentamicin loading dose 180mg  IV x 1, the 2. (if tx continued) gentamicin maintenance regimen 160mg  IV q8h 3.  Will draw serum creatinine if maintenance regimen started to verify est CrCl 4.  Will measure actual serum gentamicin levels after 48-72hr tx or as clinically indicated  Robin Figueroa, Robin Figueroa 02/18/2013,2:31 AM

## 2013-02-18 NOTE — Anesthesia Postprocedure Evaluation (Signed)
  Anesthesia Post-op Note  Patient: Robin Figueroa  Procedure(s) Performed: * No procedures listed *  Patient Location: Mother/Baby  Anesthesia Type:Epidural  Level of Consciousness: awake  Airway and Oxygen Therapy: Patient Spontanous Breathing  Post-op Pain: none  Post-op Assessment: Patient's Cardiovascular Status Stable, Respiratory Function Stable, Patent Airway, No signs of Nausea or vomiting, Adequate PO intake, Pain level controlled, No headache, No backache, No residual numbness and No residual motor weakness  Post-op Vital Signs: Reviewed and stable  Complications: No apparent anesthesia complications

## 2013-02-18 NOTE — Progress Notes (Signed)
   Subjective: No questions or concerns.    Objective: BP 89/69  Pulse 104  Temp(Src) 100.8 F (38.2 C) (Axillary)  Resp 20  Ht 5\' 2"  (1.575 m)  Wt 95.618 kg (210 lb 12.8 oz)  BMI 38.55 kg/m2  SpO2 100%  LMP 05/11/2012   Total I/O In: -  Out: 1500 [Urine:1500]  FHT:  FHR: 170's bpm, variability: moderate,  accelerations:  Present,  decelerations:  Present variable decels with contractions. UC:   regular, every 2-2.5 minutes SVE:   Dilation: 10 Effacement (%): 100 Station: 0 Exam by:: GPayne, RN  Labs: Lab Results  Component Value Date   WBC 10.6* 02/17/2013   HGB 11.1* 02/17/2013   HCT 34.4* 02/17/2013   MCV 77.1* 02/17/2013   PLT 255 02/17/2013    Assessment / Plan: Second Stage Labor Chorioamnionitis Fetal Tachycardia  Labor: Second Stage Labor Preeclampsia:  n/a Fetal Wellbeing:  Category III Pain Control:  Epidural I/D:  GBS neg Anticipated MOD:  Reassess;  Consulted with Dr. Despina HiddenEure - Give additional 1 GM Tylenol and continue pushing.  [facility]Figueroa,Robin 02/18/2013, 5:53 AM

## 2013-02-18 NOTE — Progress Notes (Signed)
   Subjective Pt reports feeling tired.    Objective: BP 89/69  Pulse 104  Temp(Src) 100.8 F (38.2 C) (Axillary)  Resp 20  Ht 5\' 2"  (1.575 m)  Wt 95.618 kg (210 lb 12.8 oz)  BMI 38.55 kg/m2  SpO2 100%  LMP 05/11/2012   Total I/O In: -  Out: 1500 [Urine:1500]  FHT:  FHR: 170's bpm, variability: minimal ,  accelerations:  Present,  decelerations:  Present variable decelerations with contractions.  UC:   regular, every 1.5-3 minutes SVE:   Dilation: 10 Effacement (%): 100 Station: 0 Exam by:: GPayne, RN  Labs: Lab Results  Component Value Date   WBC 10.6* 02/17/2013   HGB 11.1* 02/17/2013   HCT 34.4* 02/17/2013   MCV 77.1* 02/17/2013   PLT 255 02/17/2013    Assessment / Plan: Second Stage Labor Chorioamnionitis Fetal Tachycardia  Labor: Second Stage Labor Preeclampsia:  n/a Fetal Wellbeing:  Category III Pain Control:  Epidural I/D:  GBS neg Anticipated MOD:  Dr. Despina HiddenEure here to assess progress - continue to push.  [facility],Eloyse Causey 02/18/2013, 6:16 AM

## 2013-02-18 NOTE — Progress Notes (Signed)
Robin GentrySaira B Figueroa is a 23 y.o. G1P0 at 461w3d by ultrasound admitted for rupture of membranes (4am on 02/17/13)  Subjective: Feeling pressure, but not uncomfortable. Feeling mild contractions  Objective: BP 98/41  Pulse 79  Temp(Src) 100.2 F (37.9 C) (Oral)  Resp 18  Ht 5\' 2"  (1.575 m)  Wt 95.618 kg (210 lb 12.8 oz)  BMI 38.55 kg/m2  SpO2 100%  LMP 05/11/2012   Total I/O In: -  Out: 800 [Urine:800]  FHT:  FHR: 150 bpm, variability: moderate,  accelerations:  Present,  decelerations:  Absent UC:   regular, every 2-3 minutes SVE:   Dilation: 7 Effacement (%): 80 Station: -1 Exam by:: Alonna BucklerFeeney MD, Gpayne, RN  Labs: Lab Results  Component Value Date   WBC 10.6* 02/17/2013   HGB 11.1* 02/17/2013   HCT 34.4* 02/17/2013   MCV 77.1* 02/17/2013   PLT 255 02/17/2013    Assessment / Plan:  Augmentation of labor, progressing well   Labor: Progressing on pitocin.  Preeclampsia: na- Bp low normal  Fetal Wellbeing: Category I  Pain Control: Epidural  I/D: GBS neg  Anticipated MOD: NSVD   [name], [name] 02/18/2013, 1:36 AM

## 2013-02-19 LAB — CBC
HCT: 25 % — ABNORMAL LOW (ref 36.0–46.0)
HEMOGLOBIN: 8.2 g/dL — AB (ref 12.0–15.0)
MCH: 25 pg — ABNORMAL LOW (ref 26.0–34.0)
MCHC: 32.8 g/dL (ref 30.0–36.0)
MCV: 76.2 fL — ABNORMAL LOW (ref 78.0–100.0)
PLATELETS: 173 10*3/uL (ref 150–400)
RBC: 3.28 MIL/uL — ABNORMAL LOW (ref 3.87–5.11)
RDW: 15.9 % — ABNORMAL HIGH (ref 11.5–15.5)
WBC: 19.3 10*3/uL — AB (ref 4.0–10.5)

## 2013-02-19 NOTE — Progress Notes (Signed)
Post Partum Day 1 Subjective: no complaints, up ad lib, voiding and tolerating PO  Objective: Blood pressure 81/52, pulse 74, temperature 97.8 F (36.6 C), temperature source Oral, resp. rate 18, height 5\' 2"  (1.575 m), weight 95.618 kg (210 lb 12.8 oz), last menstrual period 05/11/2012, SpO2 96.00%, unknown if currently breastfeeding.  Physical Exam:  General: alert, cooperative and no distress Lochia: appropriate Uterine Fundus: firm Incision: na Perineum:  Intact, decreased swelling DVT Evaluation: No evidence of DVT seen on physical exam. No cords or calf tenderness. No significant calf/ankle edema.   Recent Labs  02/17/13 0630 02/19/13 0605  HGB 11.1* 8.2*  HCT 34.4* 25.0*    Assessment/Plan: Plan for discharge tomorrow, Breastfeeding, Circumcision prior to discharge and Contraception none   LOS: 2 days   Beverely Lowdamo, Elena 02/19/2013, 7:26 AM   I examined pt and agree with documentation above and resident plan of care. [facility],WALIDAH

## 2013-02-20 MED ORDER — MEASLES, MUMPS & RUBELLA VAC ~~LOC~~ INJ
0.5000 mL | INJECTION | Freq: Once | SUBCUTANEOUS | Status: AC
  Administered 2013-02-20: 0.5 mL via SUBCUTANEOUS
  Filled 2013-02-20: qty 0.5

## 2013-02-20 MED ORDER — ACETAMINOPHEN-CODEINE #3 300-30 MG PO TABS: 1.0000 | ORAL_TABLET | ORAL | Status: DC | PRN

## 2013-02-20 MED ORDER — POLYETHYLENE GLYCOL 3350 17 G PO PACK: 17.0000 g | PACK | Freq: Every day | ORAL | Status: DC

## 2013-02-20 NOTE — Discharge Instructions (Signed)

## 2013-02-20 NOTE — Discharge Summary (Signed)
Obstetric Discharge Summary Reason for Admission: rupture of membranes Prenatal Procedures: none Intrapartum Procedures: spontaneous vaginal delivery Postpartum Procedures: MMR vaccine Complications-Operative and Postpartum: none and 2nd degree perineal laceration Hemoglobin  Date Value Ref Range Status  02/19/2013 8.2* 12.0 - 15.0 g/dL Final     DELTA CHECK NOTED     REPEATED TO VERIFY  08/01/2012 1.5   Final     HCT  Date Value Ref Range Status  02/19/2013 25.0* 36.0 - 46.0 % Final  08/01/2012 35   Final    Physical Exam:  General: alert, cooperative, appears stated age and no distress Lochia: appropriate Uterine Fundus: firm DVT Evaluation: No evidence of DVT seen on physical exam. Negative Homan's sign. No cords or calf tenderness. No significant calf/ankle edema.  Discharge Diagnoses: Term Pregnancy-delivered  Discharge Information: Date: 02/20/2013 Activity: pelvic rest Diet: routine Medications: PNV and Tylenol #3 Condition: stable Instructions: refer to practice specific booklet Discharge to: home Follow-up Information   Follow up with [facility]. Schedule an appointment as soon as possible for a visit in 2 weeks. (Call and make appointment with Lindaann Slough at [facility] to Followup to Delivery and discuss birth control)      Hospital Course Patient admitted for SROM. Underwent augmentation of labor. Prolonged active, second stage of labor.  Chorioamnionitis treated with Amp and Gent.  Successful, spontaneous vaginal delivery. Mom and Baby doing well.  Mom non-rubella immune - will receive MMR before discharge. AFVSS. Discharged in stable condition.  Mom desires Depo for contraception.    Newborn Data: Live born female  Birth Weight: 9 lb 7.5 oz (4295 g) APGAR: 9, 9  Home with mother.  Wilnette Kales 02/20/2013, 7:43 AM  I was present for the exam and agree with above. FeSO4 and stool softener.    [place],  [place] 02/20/2013 8:07 AM

## 2013-02-20 NOTE — Lactation Note (Addendum)
This note was copied from the chart of Robin Charolotte CapuchinSaira Bergren. Lactation Consultation Note; Follow up visit before DC. Mom reports that baby was awake and crying all night.Baby asleep in bassinet at present. Did supplement with formula because baby was so fussy even after feedings. Reassurance given. Mom reports that nipples are sore because he is nursing so much. Comfort gels given with instructions for use. Has used lanolin through the night. Reports that she thinks breasts may be feeling fuller this morning.No questions at present. Reviewed BFSG and OP appointments as resources for support after DC. To call prn  Patient Name: Robin Figueroa [id number]'s Date: 02/20/2013 Reason for consult: Follow-up assessment   Maternal Data    Feeding    LATCH Score/Interventions          Comfort (Breast/Nipple): Filling, red/small blisters or bruises, mild/mod discomfort  Problem noted: Mild/Moderate discomfort Interventions (Mild/moderate discomfort): Comfort gels        Lactation Tools Discussed/Used     Consult Status Consult Status: Complete    Pamelia HoitWeeks, [name] 02/20/2013, 8:30 AM

## 2013-02-26 NOTE — Discharge Summary (Signed)
Attestation of Attending Supervision of Advanced Practitioner (CNM/NP): Evaluation and management procedures were performed by the Advanced Practitioner under my supervision and collaboration. I have reviewed the Advanced Practitioner's note and chart, and I agree with the management and plan.  Zlata Alcaide H. 4:13 PM   

## 2013-03-08 ENCOUNTER — Ambulatory Visit: Payer: BC Managed Care – PPO

## 2013-03-21 ENCOUNTER — Encounter: Payer: Self-pay | Admitting: Obstetrics and Gynecology

## 2013-03-21 ENCOUNTER — Ambulatory Visit (INDEPENDENT_AMBULATORY_CARE_PROVIDER_SITE_OTHER): Payer: BC Managed Care – PPO | Admitting: Obstetrics and Gynecology

## 2013-03-21 ENCOUNTER — Ambulatory Visit: Payer: BC Managed Care – PPO | Admitting: Obstetrics and Gynecology

## 2013-03-21 DIAGNOSIS — Z309 Encounter for contraceptive management, unspecified: Secondary | ICD-10-CM

## 2013-03-21 MED ORDER — NORELGESTROMIN-ETH ESTRADIOL 150-35 MCG/24HR TD PTWK: 1.0000 | MEDICATED_PATCH | TRANSDERMAL | Status: DC

## 2013-03-21 NOTE — Patient Instructions (Signed)

## 2013-03-21 NOTE — Progress Notes (Signed)
  Subjective:     Robin Figueroa is a 23 y.o. female G1P1001 who presents for a postpartum visit. She is 4 weeks postpartum following a spontaneous vaginal delivery. I have fully reviewed the prenatal and intrapartum course. The delivery was at 40 gestational weeks. Outcome: spontaneous vaginal delivery. Anesthesia: epidural. Postpartum course has been uncomplicated. Baby's course has been uncomplicated. Baby is feeding by breast and bottle. Bleeding no bleeding. Bowel function is normal. Bladder function is normal. Patient is not sexually active. Contraception method is abstinence. Postpartum depression screening: negative.  The following portions of the patient's history were reviewed and updated as appropriate: allergies, current medications, past family history, past medical history, past social history, past surgical history and problem list.  Review of Systems Pertinent items are noted in HPI.   Objective:    BP 99/63  Pulse 82  Resp 16  Ht 5\' 3"  (1.6 m)  Wt 191 lb (86.637 kg)  BMI 33.84 kg/m2  Breastfeeding? Yes  General:  alert, cooperative and no distress   Breasts:  inspection negative, no nipple discharge or bleeding, no masses or nodularity palpable  Lungs: clear to auscultation bilaterally  Heart:  regular rate and rhythm, S1, S2 normal, no murmur, click, rub or gallop  Abdomen: soft, non-tender; bowel sounds normal; no masses,  no organomegaly 3 cm diastasis   Vulva: normal  Vagina: normal vagina   normal some induration and redness at forchette where sutures were    Cervix:  no cervical motion tenderness  Corpus: normal size, contour, position, consistency, mobility, non-tender  Adnexa:  not evaluated  Rectal Exam: Not performed.        Assessment:     6 wk postpartum exam. Pap smear not done at today's visit.   Plan:    1. Contraception: Ortho-Evra patches weekly 2. Abd tightening exercises and weight loss 3. Follow up in: 6 months or as needed.

## 2013-03-25 NOTE — Progress Notes (Signed)
DNKA

## 2013-11-11 ENCOUNTER — Encounter: Payer: Self-pay | Admitting: Obstetrics and Gynecology

## 2014-04-11 ENCOUNTER — Other Ambulatory Visit: Payer: Self-pay | Admitting: Obstetrics and Gynecology

## 2014-05-27 ENCOUNTER — Telehealth: Payer: Self-pay

## 2014-05-27 NOTE — Telephone Encounter (Signed)
Patient wanted a refill on her birth control.Pt needs to be seen before we can refill. Pt lives in [place] and she will find a new ob/gyn there.

## 2015-07-31 ENCOUNTER — Ambulatory Visit: Payer: Self-pay | Admitting: Family

## 2015-07-31 ENCOUNTER — Encounter: Payer: Self-pay | Admitting: Family

## 2015-07-31 NOTE — Progress Notes (Signed)
Pt is a former pt of ours who moved to [place] and is here visiting family

## 2015-08-02 NOTE — Progress Notes (Signed)
Pt is not planning to continue OB care here in office; desires ultrasound.  Explained to patient that is something completed at hospital and not office; decides not to stay for visit.

## 2015-08-23 ENCOUNTER — Encounter (HOSPITAL_COMMUNITY): Payer: Self-pay | Admitting: Advanced Practice Midwife

## 2015-08-23 ENCOUNTER — Inpatient Hospital Stay (HOSPITAL_COMMUNITY): Payer: BLUE CROSS/BLUE SHIELD

## 2015-08-23 ENCOUNTER — Inpatient Hospital Stay (HOSPITAL_COMMUNITY)
Admission: AD | Admit: 2015-08-23 | Discharge: 2015-08-23 | Disposition: A | Discharge status: Home / Self Care | Payer: BLUE CROSS/BLUE SHIELD | Source: Ambulatory Visit | Attending: Obstetrics & Gynecology | Admitting: Obstetrics & Gynecology

## 2015-08-23 DIAGNOSIS — Z3A23 23 weeks gestation of pregnancy: Secondary | ICD-10-CM | POA: Insufficient documentation

## 2015-08-23 DIAGNOSIS — O4692 Antepartum hemorrhage, unspecified, second trimester: Secondary | ICD-10-CM | POA: Diagnosis present

## 2015-08-23 LAB — URINALYSIS, ROUTINE W REFLEX MICROSCOPIC
Bilirubin Urine: NEGATIVE
Glucose, UA: NEGATIVE mg/dL
HGB URINE DIPSTICK: NEGATIVE
Ketones, ur: NEGATIVE mg/dL
Leukocytes, UA: NEGATIVE
Nitrite: NEGATIVE
PH: 7 (ref 5.0–8.0)
Protein, ur: NEGATIVE mg/dL
SPECIFIC GRAVITY, URINE: 1.015 (ref 1.005–1.030)

## 2015-08-23 LAB — CBC
HEMATOCRIT: 32.2 % — AB (ref 36.0–46.0)
Hemoglobin: 10.3 g/dL — ABNORMAL LOW (ref 12.0–15.0)
MCH: 24.5 pg — ABNORMAL LOW (ref 26.0–34.0)
MCHC: 32 g/dL (ref 30.0–36.0)
MCV: 76.5 fL — ABNORMAL LOW (ref 78.0–100.0)
Platelets: 211 10*3/uL (ref 150–400)
RBC: 4.21 MIL/uL (ref 3.87–5.11)
RDW: 16.1 % — AB (ref 11.5–15.5)
WBC: 10.2 10*3/uL (ref 4.0–10.5)

## 2015-08-23 LAB — WET PREP, GENITAL
Clue Cells Wet Prep HPF POC: NONE SEEN
SPERM: NONE SEEN
Trich, Wet Prep: NONE SEEN
Yeast Wet Prep HPF POC: NONE SEEN

## 2015-08-23 NOTE — MAU Note (Signed)
Urine sent to lab 

## 2015-08-23 NOTE — MAU Note (Signed)
Had some spotting today.  Last night had severe back pain, no longer hurting.  Getting care in [place]. Has not had an US since 14wks.

## 2015-08-23 NOTE — Discharge Instructions (Signed)
Vaginal Bleeding During Pregnancy, Second Trimester A small amount of bleeding (spotting) from the vagina is relatively common in pregnancy. It usually stops on its own. Various things can cause bleeding or spotting in pregnancy. Some bleeding may be related to the pregnancy, and some may not. Sometimes the bleeding is normal and is not a problem. However, bleeding can also be a sign of something serious. Be sure to tell your health care provider about any vaginal bleeding right away. Some possible causes of vaginal bleeding during the second trimester include:  Infection, inflammation, or growths on the cervix.   The placenta may be partially or completely covering the opening of the cervix inside the uterus (placenta previa).  The placenta may have separated from the uterus (abruption of the placenta).   You may be having early (preterm) labor.   The cervix may not be strong enough to keep a baby inside the uterus (cervical insufficiency).   Tiny cysts may have developed in the uterus instead of pregnancy tissue (molar pregnancy). HOME CARE INSTRUCTIONS  Watch your condition for any changes. The following actions may help to lessen any discomfort you are feeling:  Follow your health care provider's instructions for limiting your activity. If your health care provider orders bed rest, you may need to stay in bed and only get up to use the bathroom. However, your health care provider may allow you to continue light activity.  If needed, make plans for someone to help with your regular activities and responsibilities while you are on bed rest.  Keep track of the number of pads you use each day, how often you change pads, and how soaked (saturated) they are. Write this down.  Do not use tampons. Do not douche.  Do not have sexual intercourse or orgasms until approved by your health care provider.  If you pass any tissue from your vagina, save the tissue so you can show it to your  health care provider.  Only take over-the-counter or prescription medicines as directed by your health care provider.  Do not take aspirin because it can make you bleed.  Do not exercise or perform any strenuous activities or heavy lifting without your health care provider's permission.  Keep all follow-up appointments as directed by your health care provider. SEEK MEDICAL CARE IF:  You have any vaginal bleeding during any part of your pregnancy.  You have cramps or labor pains.  You have a fever, not controlled by medicine. SEEK IMMEDIATE MEDICAL CARE IF:   You have severe cramps in your back or belly (abdomen).  You have contractions.  You have chills.  You pass large clots or tissue from your vagina.  Your bleeding increases.  You feel light-headed or weak, or you have fainting episodes.  You are leaking fluid or have a gush of fluid from your vagina. MAKE SURE YOU:  Understand these instructions.  Will watch your condition.  Will get help right away if you are not doing well or get worse.   This information is not intended to replace advice given to you by your health care provider. Make sure you discuss any questions you have with your health care provider.   Document Released: 10/06/2004 Document Revised: 01/01/2013 Document Reviewed: 09/03/2012 Elsevier Interactive Patient Education 2016 [employer]   Pelvic Rest Pelvic rest is sometimes recommended for women when:   The placenta is partially or completely covering the opening of the cervix (placenta previa).  There is bleeding between the uterine wall  and the amniotic sac in the first trimester (subchorionic hemorrhage). °· The cervix begins to open without labor starting (incompetent cervix, cervical insufficiency). °· The labor is too early (preterm labor). °HOME CARE INSTRUCTIONS °· Do not have sexual intercourse, stimulation, or an orgasm. °· Do not use tampons, douche, or put anything in the  vagina. °· Do not lift anything over 10 pounds (4.5 kg). °· Avoid strenuous activity or straining your pelvic muscles. °SEEK MEDICAL CARE IF:  °· You have any vaginal bleeding during pregnancy. Treat this as a potential emergency. °· You have cramping pain felt low in the stomach (stronger than menstrual cramps). °· You notice vaginal discharge (watery, mucus, or bloody). °· You have a low, dull backache. °· There are regular contractions or uterine tightening. °SEEK IMMEDIATE MEDICAL CARE IF: °You have vaginal bleeding and have placenta previa.  °  °This information is not intended to replace advice given to you by your health care provider. Make sure you discuss any questions you have with your health care provider. °  °Document Released: 04/23/2010 Document Revised: 03/21/2011 Document Reviewed: 06/30/2014 °Elsevier Interactive Patient Education ©2016 Elsevier Inc. ° °

## 2015-08-23 NOTE — MAU Provider Note (Signed)
Chief Complaint:  Vaginal Bleeding   First Provider Initiated Contact with Patient 08/23/15 1603     HPI: Robin Figueroa is a 25 y.o. G2P1001 at 101w2d who presents to maternity admissions reporting spotting today and back pain yesterday that has resolved.  Gets care in [place]. Last [place] 14 weeks. Blood type B pos.   Location: mid low back Quality: ache Severity: 5/10 in pain scale at worst. None now Duration: few hours Context: none Timing: constant Modifying factors: resolved w/ rest Associated signs and symptoms: Pos for spotting.   Denies contractions, leakage of fluid. Good fetal movement.    Past Medical History: Past Medical History:  Diagnosis Date  . Anemia     Past obstetric history: OB History  Gravida Para Term Preterm AB Living  SAB TAB Ectopic Multiple Live Births          1    # Outcome Date GA Lbr Len/2nd Weight Sex Delivery Anes PTL Lv  2 Current           1 Term 02/18/13 [redacted]w[redacted]d 22:40 / 04:54 9 lb 7.5 oz (4.295 kg) M Vag-Spont EPI  LIV      Past Surgical History: History reviewed. No pertinent surgical history.   Family History: Family History  Problem Relation Age of Onset  . Diabetes Father     Social History: Social History  Substance Use Topics  . Smoking status: Never Smoker  . Smokeless tobacco: Never Used  . Alcohol use No    Allergies: No Known Allergies  Meds:  No prescriptions prior to admission.    I have reviewed patient's Past Medical Hx, Surgical Hx, Family Hx, Social Hx, medications and allergies.   ROS:  Review of Systems  Physical Exam  Patient Vitals for the past 24 hrs:  BP Temp Temp src Pulse Resp  08/23/15 1328 102/62 98.5 F (36.9 C) Oral 100 18   Constitutional: Well-developed, well-nourished female in no acute distress.  Cardiovascular: normal rate Respiratory: normal effort GI: Abd soft, non-tender, gravid appropriate for gestational age.  MS: Extremities nontender, no edema, normal  ROM Neurologic: Alert and oriented x 4.  GU: Neg CVAT.  Pelvic: NEFG, physiologic discharge, no blood, cervix slightly friable. No CMT  Closed and long   FHT:  Baseline 150 , moderate variability, accelerations present, no decelerations Contractions: None   Labs:  CBC   Collection Time: 08/23/15  3:27 PM  Result Value Ref Range   WBC 10.2 4.0 - 10.5 K/uL   RBC 4.21 3.87 - 5.11 MIL/uL   Hemoglobin 10.3 (L) 12.0 - 15.0 g/dL   HCT 09.8 (L) 11.9 - 14.7 %   MCV 76.5 (L) 78.0 - 100.0 fL   MCH 24.5 (L) 26.0 - 34.0 pg   MCHC 32.0 30.0 - 36.0 g/dL   RDW 82.9 (H) 56.2 - 13.0 %   Platelets 211 150 - 400 K/uL  Wet prep, genital   Collection Time: 08/23/15  4:15 PM  Result Value Ref Range   Yeast Wet Prep HPF POC NONE SEEN NONE SEEN   Trich, Wet Prep NONE SEEN NONE SEEN   Clue Cells Wet Prep HPF POC NONE SEEN NONE SEEN   WBC, Wet Prep HPF POC FEW (A) NONE SEEN   Sperm NONE SEEN   Urinalysis, Routine w reflex microscopic (not at [facility])   Collection Time: 08/23/15  4:15 PM  Result Value Ref Range   Color, Urine YELLOW  YELLOW   APPearance CLEAR CLEAR   Specific Gravity, Urine 1.015 1.005 - 1.030   pH 7.0 5.0 - 8.0   Glucose, UA NEGATIVE NEGATIVE mg/dL   Hgb urine dipstick NEGATIVE NEGATIVE   Bilirubin Urine NEGATIVE NEGATIVE   Ketones, ur NEGATIVE NEGATIVE mg/dL   Protein, ur NEGATIVE NEGATIVE mg/dL   Nitrite NEGATIVE NEGATIVE   Leukocytes, UA NEGATIVE NEGATIVE   Imaging:  No previa, CL 5.2 cm. Normal AFI.   MAU Course: Orders Placed This Encounter  Procedures  . Wet prep, genital  . US MFM OB LIMITED  . HIV antibody (routine testing) (NOT for [facility])  . CBC  . Lab instructions    MDM: - second trimester spotting likely due to friable cervix. Has appearance of normal ectropion, low suspicion for infection. Cultures sent. Cervix long and closed.   Assessment: 1. Second trimester bleeding     Plan: Discharge home in stable condition.  Pelvic rest x 1 week Bleeding  precautions.  Preterm Labor precautions and fetal kick counts  Follow-up Information    Dorathy KinsmanVirginia Hyland Mollenkopf, [place] .   Specialty:  Obstetrics and Gynecology Contact information: [address] [place] [address] [phone]        your obstetrician Follow up in 1 month(s).   Why:  as scheduled or sooner as needed if symptoms worsen.        [facility] .   Why:  as needed in emergencies Contact information: [address] [id number] [place] [id number] [phone]            Medication List    TAKE these medications   ferrous sulfate 325 (65 FE) MG tablet Take 325 mg by mouth daily with breakfast.   prenatal multivitamin Tabs tablet Take 1 tablet by mouth daily.       [place] [facility] [place] Kaw[place] Bischoff, CNM 08/23/2015 4:04 PM

## 2015-08-24 LAB — GC/CHLAMYDIA PROBE AMP (~~LOC~~) NOT AT ARMC
Chlamydia: NEGATIVE
NEISSERIA GONORRHEA: NEGATIVE

## 2015-08-25 LAB — HIV ANTIBODY (ROUTINE TESTING W REFLEX): HIV SCREEN 4TH GENERATION: NONREACTIVE

## 2016-06-04 ENCOUNTER — Encounter (HOSPITAL_COMMUNITY): Payer: Self-pay

## 2017-07-30 IMAGING — US US MFM OB LIMITED
1 series · 15 of 28 positions shown · non-contrast
Comparison: none

MAU/Triage

1  NARIZADY RADJABU OSIKARI           [id number]      [id number]     [id number]
Indications
23 weeks gestation of pregnancy
Vaginal bleeding in pregnancy, second
trimester (spotting since AM)
OB History
Gravidity:    2         Term:   1
Living:       1
Fetal Evaluation
Num Of Fetuses:     1
Fetal Heart         148
Rate(bpm):
Cardiac Activity:   Observed
Presentation:       Cephalic
Placenta:           Anterior, above cervical os
P. Cord Insertion:  Not well visualized
Amniotic Fluid
AFI FV:      Subjectively within normal limits
Largest Pocket(cm)
6.4
Comment:    No definite evidence of placental abruption.
Gestational Age
LMP:           23w 2d        Date:  03/13/15                 EDD:   12/18/15
Best:          23w 2d     Det. By:  LMP  (03/13/15)          EDD:   12/18/15
Cervix Uterus Adnexa
Cervix
Length:            5.2  cm.
Normal appearance by transabdominal scan.
Uterus
No abnormality visualized.
Left Ovary
Within normal limits.
Right Ovary
Cul De Sac:   No free fluid seen.
Adnexa:       No abnormality visualized.
Impression
INDICATION: 25 yr old [id number] at 05w0d with vaginal spotting
for fetal ultrasound. Remote read.

[Series 1: us mfm ob limited · 15 of 29 slices shown]
[im 1/29]
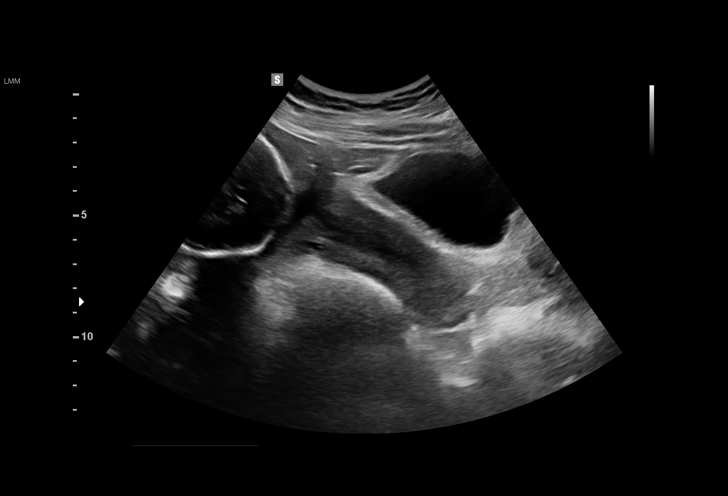
[im 3/29]
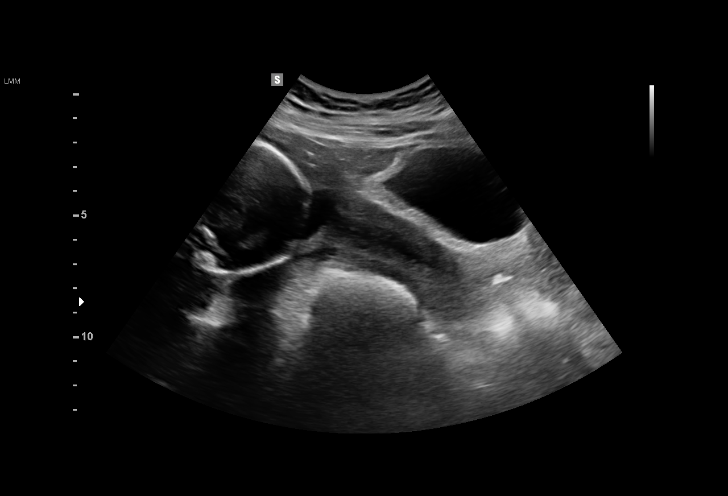
[im 5/29]
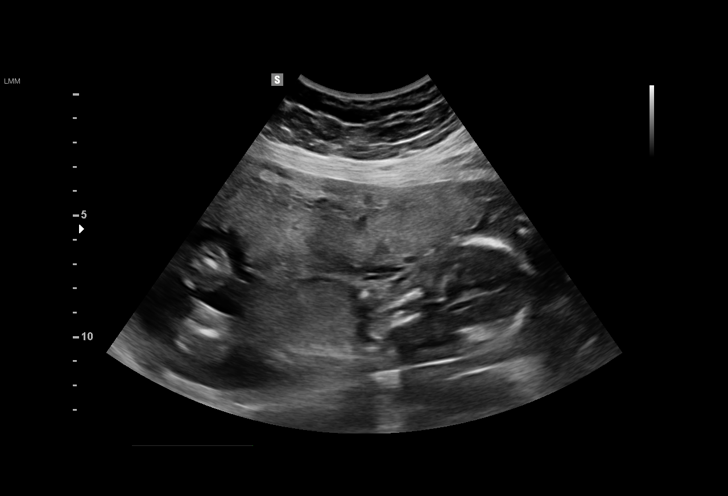
[im 7/29]
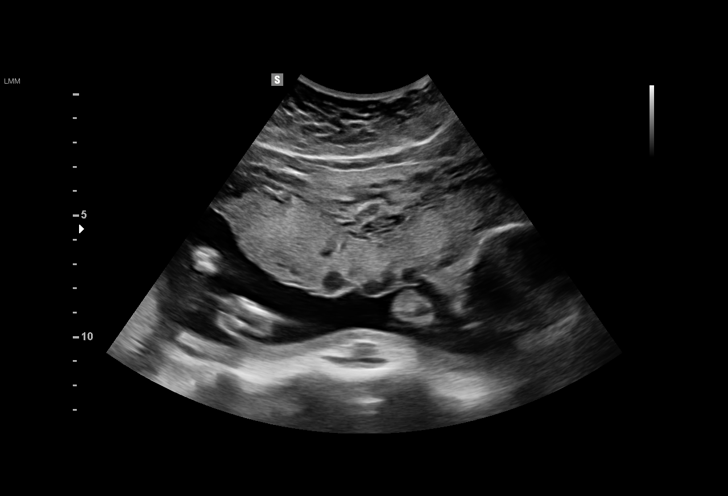
[im 9/29]
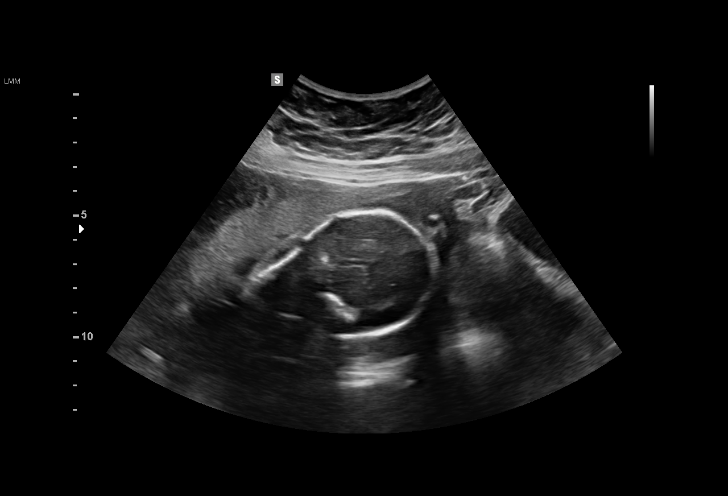
[im 11/29]
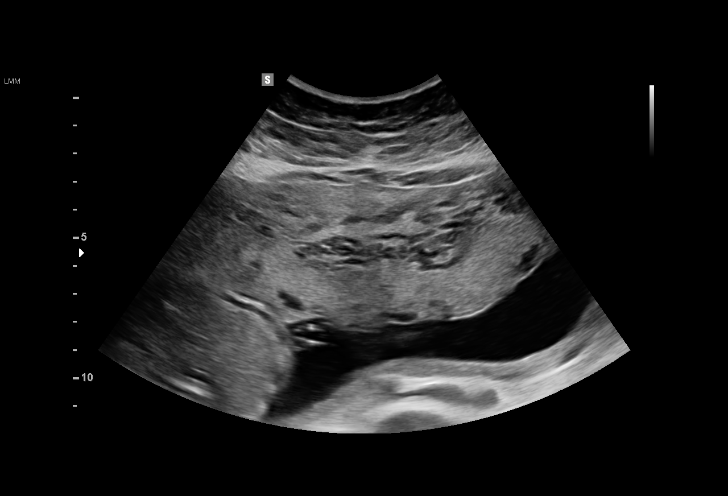
[im 13/29]
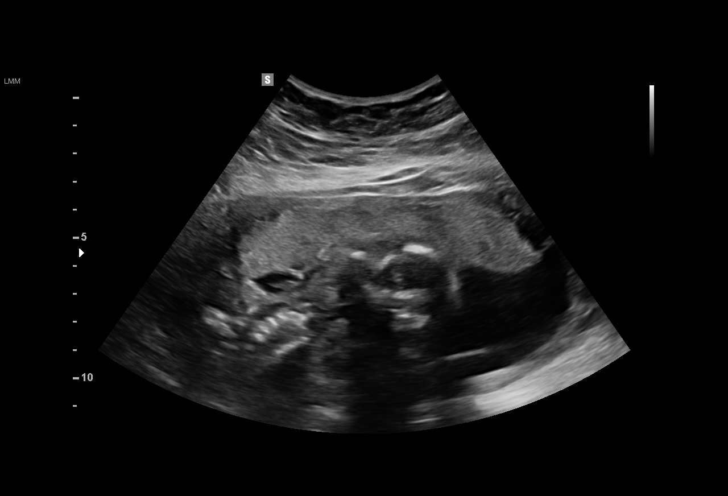
[im 15/29]
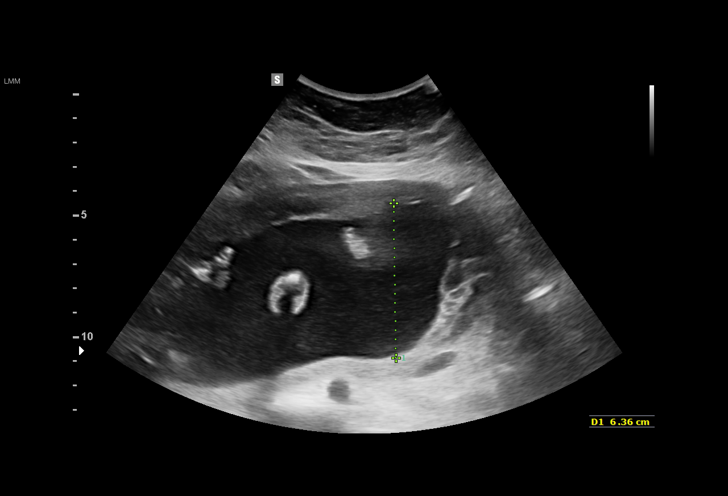
[im 16/29]
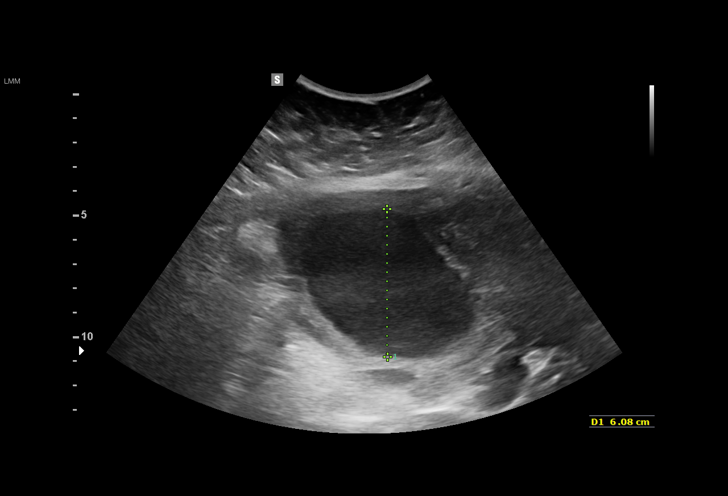
[im 18/29]
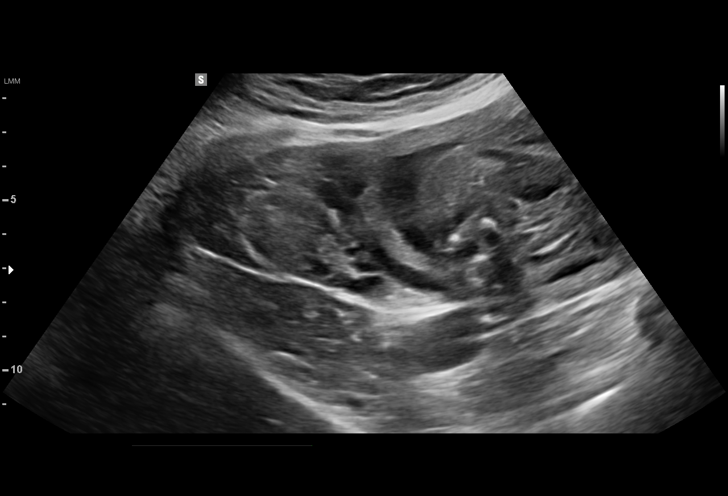
[im 20/29]
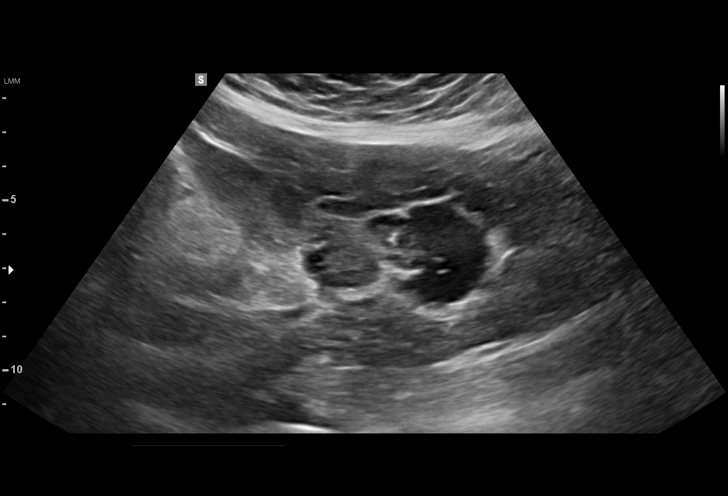
[im 22/29]
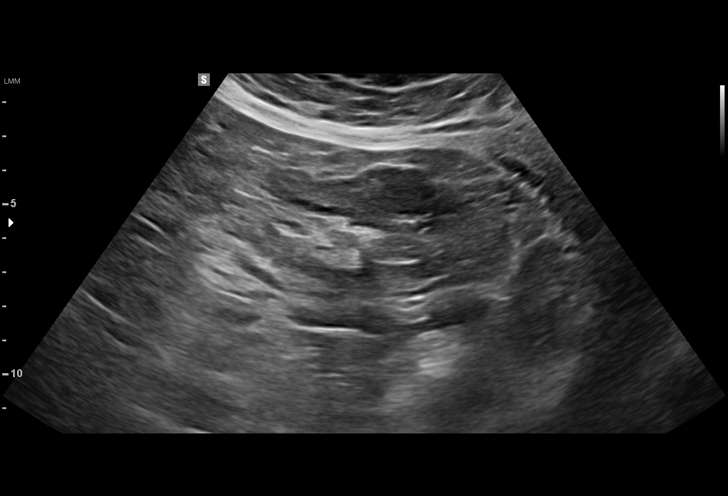
[im 24/29]
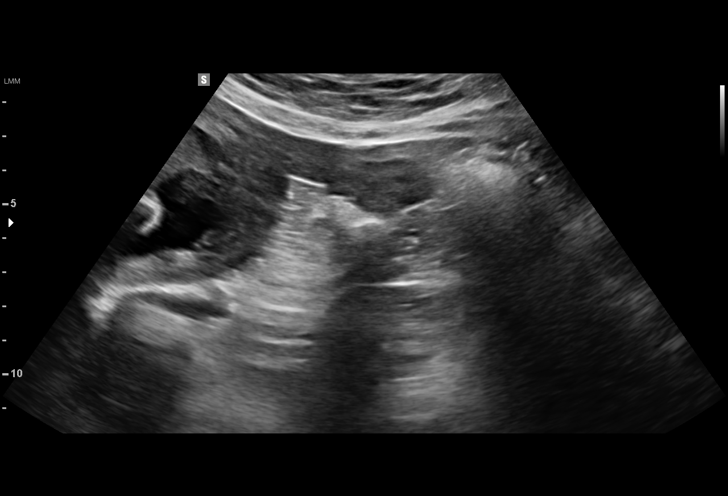
[im 26/29]
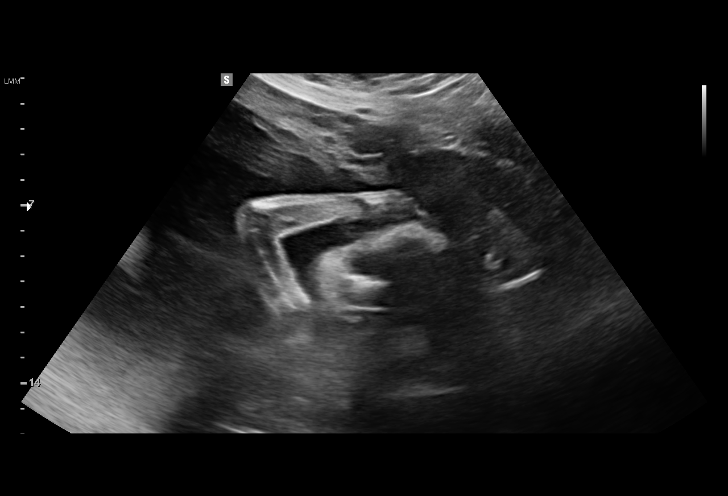
[im 29/29]
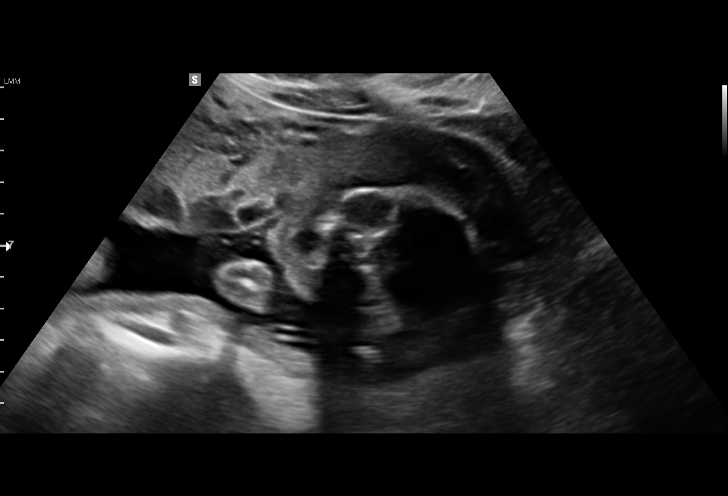

[15 of 28 positions shown; findings below may reference images not displayed]

FINDINGS: 1. Single intrauterine pregnancy.
2. Anterior placenta without evidence of previa.
3. Normal amniotic fluid volume.
4. Normal transabdominal cervical length.
Recommendations

1. No evidence of previa.
2. Normal cervical length.
3. Management per primary OB.
